# Patient Record
Sex: Male | Born: 1940 | Race: White | Hispanic: No | Smoking: Never smoker
Health system: Southern US, Community
[De-identification: ages and names within clinical notes are randomized; demographics above are authoritative.]

## PROBLEM LIST (undated history)

## (undated) DIAGNOSIS — N529 Male erectile dysfunction, unspecified: Secondary | ICD-10-CM

## (undated) DIAGNOSIS — E669 Obesity, unspecified: Secondary | ICD-10-CM

## (undated) DIAGNOSIS — A048 Other specified bacterial intestinal infections: Secondary | ICD-10-CM

## (undated) DIAGNOSIS — E785 Hyperlipidemia, unspecified: Secondary | ICD-10-CM

## (undated) DIAGNOSIS — K219 Gastro-esophageal reflux disease without esophagitis: Secondary | ICD-10-CM

## (undated) DIAGNOSIS — C439 Malignant melanoma of skin, unspecified: Secondary | ICD-10-CM

## (undated) DIAGNOSIS — I219 Acute myocardial infarction, unspecified: Secondary | ICD-10-CM

## (undated) DIAGNOSIS — I10 Essential (primary) hypertension: Secondary | ICD-10-CM

## (undated) DIAGNOSIS — N200 Calculus of kidney: Secondary | ICD-10-CM

## (undated) DIAGNOSIS — K635 Polyp of colon: Secondary | ICD-10-CM

## (undated) DIAGNOSIS — I2510 Atherosclerotic heart disease of native coronary artery without angina pectoris: Secondary | ICD-10-CM

---

## 2004-05-24 ENCOUNTER — Other Ambulatory Visit: Payer: Self-pay

## 2005-07-26 ENCOUNTER — Ambulatory Visit: Payer: Self-pay | Admitting: Ophthalmology

## 2005-07-31 ENCOUNTER — Ambulatory Visit: Payer: Self-pay | Admitting: Ophthalmology

## 2010-01-23 ENCOUNTER — Ambulatory Visit: Payer: Self-pay | Admitting: Gastroenterology

## 2010-03-05 ENCOUNTER — Emergency Department: Payer: Self-pay | Admitting: Emergency Medicine

## 2013-04-15 ENCOUNTER — Ambulatory Visit: Payer: Self-pay | Admitting: Gastroenterology

## 2013-12-22 DIAGNOSIS — E782 Mixed hyperlipidemia: Secondary | ICD-10-CM | POA: Insufficient documentation

## 2013-12-22 DIAGNOSIS — K219 Gastro-esophageal reflux disease without esophagitis: Secondary | ICD-10-CM | POA: Insufficient documentation

## 2013-12-22 DIAGNOSIS — I2510 Atherosclerotic heart disease of native coronary artery without angina pectoris: Secondary | ICD-10-CM | POA: Insufficient documentation

## 2015-03-11 DIAGNOSIS — I10 Essential (primary) hypertension: Secondary | ICD-10-CM | POA: Insufficient documentation

## 2015-10-14 DIAGNOSIS — K2270 Barrett's esophagus without dysplasia: Secondary | ICD-10-CM | POA: Diagnosis not present

## 2015-10-14 DIAGNOSIS — Z1211 Encounter for screening for malignant neoplasm of colon: Secondary | ICD-10-CM | POA: Diagnosis not present

## 2015-10-14 DIAGNOSIS — Z86010 Personal history of colonic polyps: Secondary | ICD-10-CM | POA: Diagnosis not present

## 2015-11-09 ENCOUNTER — Encounter: Payer: Self-pay | Admitting: *Deleted

## 2015-11-10 ENCOUNTER — Ambulatory Visit: Payer: PPO | Admitting: Anesthesiology

## 2015-11-10 ENCOUNTER — Encounter
Admission: RE | Disposition: A | Discharge status: Home / Self Care | Payer: Self-pay | Source: Ambulatory Visit | Attending: Gastroenterology

## 2015-11-10 ENCOUNTER — Encounter: Payer: Self-pay | Admitting: Anesthesiology

## 2015-11-10 ENCOUNTER — Ambulatory Visit
Admission: RE | Admit: 2015-11-10 | Discharge: 2015-11-10 | Disposition: A | Discharge status: Home / Self Care | Payer: PPO | Source: Ambulatory Visit | Attending: Gastroenterology | Admitting: Gastroenterology

## 2015-11-10 DIAGNOSIS — N529 Male erectile dysfunction, unspecified: Secondary | ICD-10-CM | POA: Insufficient documentation

## 2015-11-10 DIAGNOSIS — I2510 Atherosclerotic heart disease of native coronary artery without angina pectoris: Secondary | ICD-10-CM | POA: Insufficient documentation

## 2015-11-10 DIAGNOSIS — K649 Unspecified hemorrhoids: Secondary | ICD-10-CM | POA: Diagnosis not present

## 2015-11-10 DIAGNOSIS — K222 Esophageal obstruction: Secondary | ICD-10-CM | POA: Insufficient documentation

## 2015-11-10 DIAGNOSIS — I10 Essential (primary) hypertension: Secondary | ICD-10-CM | POA: Diagnosis not present

## 2015-11-10 DIAGNOSIS — Z7982 Long term (current) use of aspirin: Secondary | ICD-10-CM | POA: Diagnosis not present

## 2015-11-10 DIAGNOSIS — D123 Benign neoplasm of transverse colon: Secondary | ICD-10-CM | POA: Diagnosis not present

## 2015-11-10 DIAGNOSIS — K2270 Barrett's esophagus without dysplasia: Secondary | ICD-10-CM | POA: Insufficient documentation

## 2015-11-10 DIAGNOSIS — E785 Hyperlipidemia, unspecified: Secondary | ICD-10-CM | POA: Insufficient documentation

## 2015-11-10 DIAGNOSIS — K219 Gastro-esophageal reflux disease without esophagitis: Secondary | ICD-10-CM | POA: Diagnosis not present

## 2015-11-10 DIAGNOSIS — D120 Benign neoplasm of cecum: Secondary | ICD-10-CM | POA: Insufficient documentation

## 2015-11-10 DIAGNOSIS — Z85820 Personal history of malignant melanoma of skin: Secondary | ICD-10-CM | POA: Insufficient documentation

## 2015-11-10 DIAGNOSIS — I252 Old myocardial infarction: Secondary | ICD-10-CM | POA: Diagnosis not present

## 2015-11-10 DIAGNOSIS — K635 Polyp of colon: Secondary | ICD-10-CM | POA: Diagnosis not present

## 2015-11-10 DIAGNOSIS — Z1211 Encounter for screening for malignant neoplasm of colon: Secondary | ICD-10-CM | POA: Diagnosis not present

## 2015-11-10 DIAGNOSIS — Z79899 Other long term (current) drug therapy: Secondary | ICD-10-CM | POA: Insufficient documentation

## 2015-11-10 DIAGNOSIS — D122 Benign neoplasm of ascending colon: Secondary | ICD-10-CM | POA: Diagnosis not present

## 2015-11-10 DIAGNOSIS — Z86010 Personal history of colonic polyps: Secondary | ICD-10-CM | POA: Diagnosis not present

## 2015-11-10 HISTORY — PX: COLONOSCOPY WITH PROPOFOL: SHX5780

## 2015-11-10 HISTORY — DX: Other specified bacterial intestinal infections: A04.8

## 2015-11-10 HISTORY — PX: ESOPHAGOGASTRODUODENOSCOPY: SHX5428

## 2015-11-10 HISTORY — DX: Polyp of colon: K63.5

## 2015-11-10 HISTORY — DX: Hyperlipidemia, unspecified: E78.5

## 2015-11-10 HISTORY — DX: Calculus of kidney: N20.0

## 2015-11-10 HISTORY — DX: Gastro-esophageal reflux disease without esophagitis: K21.9

## 2015-11-10 HISTORY — DX: Atherosclerotic heart disease of native coronary artery without angina pectoris: I25.10

## 2015-11-10 HISTORY — DX: Obesity, unspecified: E66.9

## 2015-11-10 HISTORY — DX: Malignant melanoma of skin, unspecified: C43.9

## 2015-11-10 HISTORY — DX: Male erectile dysfunction, unspecified: N52.9

## 2015-11-10 HISTORY — DX: Acute myocardial infarction, unspecified: I21.9

## 2015-11-10 SURGERY — COLONOSCOPY WITH PROPOFOL
Anesthesia: General

## 2015-11-10 MED ORDER — PROPOFOL 500 MG/50ML IV EMUL
INTRAVENOUS | Status: DC | PRN
  Administered 2015-11-10: 150 ug/kg/min via INTRAVENOUS

## 2015-11-10 MED ORDER — SODIUM CHLORIDE 0.9 % IV SOLN
INTRAVENOUS | Status: DC
  Administered 2015-11-10: 1000 mL via INTRAVENOUS

## 2015-11-10 MED ORDER — PROPOFOL 10 MG/ML IV BOLUS
INTRAVENOUS | Status: DC | PRN
  Administered 2015-11-10: 100 mg via INTRAVENOUS
  Administered 2015-11-10: 50 mg via INTRAVENOUS

## 2015-11-10 MED ORDER — SODIUM CHLORIDE 0.9 % IV SOLN: INTRAVENOUS | Status: DC

## 2015-11-10 MED ORDER — LIDOCAINE HCL (CARDIAC) 20 MG/ML IV SOLN
INTRAVENOUS | Status: DC | PRN
  Administered 2015-11-10: 100 mg via INTRAVENOUS

## 2015-11-10 MED ORDER — GLYCOPYRROLATE 0.2 MG/ML IJ SOLN
INTRAMUSCULAR | Status: DC | PRN
  Administered 2015-11-10: 0.2 mg via INTRAVENOUS

## 2015-11-10 NOTE — Op Note (Signed)
[facility] Gastroenterology Patient Name: Jay Gregory Procedure Date: 11/10/2015 7:38 AM MRN: [id number] Account #: [id number] Date of Birth: 1941/01/24 Admit Type: Outpatient Age: 75 Room: [facility] ENDO ROOM 4 Gender: Male Note Status: Finalized Procedure:         Colonoscopy Indications:       Personal history of colonic polyps Providers:         Lupita Dawn. Candace Cruise, MD Referring MD:      Dion Body (Referring MD) Medicines:         Monitored Anesthesia Care Complications:     No immediate complications. Procedure:         Pre-Anesthesia Assessment:                    - Prior to the procedure, a History and Physical was                     performed, and patient medications, allergies and                     sensitivities were reviewed. The patient's tolerance of                     previous anesthesia was reviewed.                    - The risks and benefits of the procedure and the sedation                     options and risks were discussed with the patient. All                     questions were answered and informed consent was obtained.                    - After reviewing the risks and benefits, the patient was                     deemed in satisfactory condition to undergo the procedure.                    After obtaining informed consent, the colonoscope was                     passed under direct vision. Throughout the procedure, the                     patient's blood pressure, pulse, and oxygen saturations                     were monitored continuously. The Colonoscope was                     introduced through the anus and advanced to the the cecum,                     identified by appendiceal orifice and ileocecal valve. The                     colonoscopy was performed without difficulty. The patient                     tolerated the procedure well. The quality of the bowel  preparation was good. Findings:      A few sessile  polyps were found in the cecum. The polyps were diminutive       in size. These polyps were removed with a jumbo cold forceps. Resection       and retrieval were complete.      A diminutive polyp was found in the distal transverse colon. The polyp       was sessile. The polyp was removed with a jumbo cold forceps. Resection       and retrieval were complete.      A small polyp was found in the distal transverse colon. The polyp was       sessile. The polyp was removed with a cold snare. Resection and       retrieval were complete.      The exam was otherwise without abnormality. Impression:        - A few diminutive polyps in the cecum. Resected and                     retrieved.                    - One diminutive polyp in the distal transverse colon.                     Resected and retrieved.                    - One small polyp in the distal transverse colon. Resected                     and retrieved.                    - The examination was otherwise normal. Recommendation:    - Discharge patient to home.                    - Await pathology results.                    - Repeat colonoscopy in 5 years for surveillance based on                     pathology results. Procedure Code(s): --- Professional ---                    [phone], Colonoscopy, flexible; with removal of tumor(s),                     polyp(s), or other lesion(s) by snare technique                    45380, 59, Colonoscopy, flexible; with biopsy, single or                     multiple Diagnosis Code(s): --- Professional ---                    D12.0, Benign neoplasm of cecum                    D12.3, Benign neoplasm of transverse colon                    Z86.010, Personal history of colonic polyps CPT copyright 2014 American Medical Association. All rights reserved. The codes documented in this report  are preliminary and upon coder review may  be revised to meet current compliance requirements. Hulen Luster,  MD 11/10/2015 8:39:10 AM This report has been signed electronically. Number of Addenda: 0 Note Initiated On: 11/10/2015 7:38 AM Scope Withdrawal Time: 0 hours 14 minutes 7 seconds  Total Procedure Duration: 0 hours 17 minutes 17 seconds       [facility]

## 2015-11-10 NOTE — Anesthesia Preprocedure Evaluation (Signed)
Anesthesia Evaluation  Patient identified by MRN, date of birth, ID band Patient awake    Reviewed: Allergy & Precautions, H&P , NPO status , Patient's Chart, lab work & pertinent test results, reviewed documented beta blocker date and time   History of Anesthesia Complications Negative for: history of anesthetic complications  Airway Mallampati: III  TM Distance: >3 FB Neck ROM: full    Dental no notable dental hx. (+) Caps, Edentulous Upper, Partial Lower, Missing, Poor Dentition   Pulmonary neg pulmonary ROS,    Pulmonary exam normal breath sounds clear to auscultation       Cardiovascular Exercise Tolerance: Good hypertension, (-) angina+ CAD, + Past MI and + Cardiac Stents (2 stents in 1999 and 2000)  (-) CABG Normal cardiovascular exam(-) dysrhythmias (-) Valvular Problems/Murmurs Rhythm:regular Rate:Normal     Neuro/Psych negative neurological ROS  negative psych ROS   GI/Hepatic Neg liver ROS, GERD  Medicated and Controlled,  Endo/Other  negative endocrine ROS  Renal/GU Renal disease (kidney stones)  negative genitourinary   Musculoskeletal   Abdominal   Peds  Hematology negative hematology ROS (+)   Anesthesia Other Findings Past Medical History:   Colon polyps                                                 Coronary artery disease                                      Erectile dysfunction                                         GERD (gastroesophageal reflux disease)                       Helicobacter pylori infection                                Melanoma ([place])                                                 Comment:Right ear   Hyperlipidemia                                               Kidney stone                                                 Myocardial infarction ([place])                                  Obesity  Reproductive/Obstetrics negative  OB ROS                             Anesthesia Physical Anesthesia Plan  ASA: III  Anesthesia Plan: General   Post-op Pain Management:    Induction:   Airway Management Planned:   Additional Equipment:   Intra-op Plan:   Post-operative Plan:   Informed Consent: I have reviewed the patients History and Physical, chart, labs and discussed the procedure including the risks, benefits and alternatives for the proposed anesthesia with the patient or authorized representative who has indicated his/her understanding and acceptance.   Dental Advisory Given  Plan Discussed with: Anesthesiologist, CRNA and Surgeon  Anesthesia Plan Comments:         Anesthesia Quick Evaluation

## 2015-11-10 NOTE — Op Note (Signed)
[facility] Gastroenterology Patient Name: Jay Gregory Procedure Date: 11/10/2015 7:39 AM MRN: [id number] Account #: [id number] Date of Birth: September 11, 1941 Admit Type: Outpatient Age: 75 Room: [facility] ENDO ROOM 4 Gender: Male Note Status: Finalized Procedure:         Upper GI endoscopy Providers:         Lupita Dawn. Candace Cruise, MD Referring MD:      Dion Body (Referring MD) Medicines:         Monitored Anesthesia Care Complications:     No immediate complications. Procedure:         Pre-Anesthesia Assessment:                    - Prior to the procedure, a History and Physical was                     performed, and patient medications, allergies and                     sensitivities were reviewed. The patient's tolerance of                     previous anesthesia was reviewed.                    - The risks and benefits of the procedure and the sedation                     options and risks were discussed with the patient. All                     questions were answered and informed consent was obtained.                    - After reviewing the risks and benefits, the patient was                     deemed in satisfactory condition to undergo the procedure.                    After obtaining informed consent, the endoscope was passed                     under direct vision. Throughout the procedure, the                     patient's blood pressure, pulse, and oxygen saturations                     were monitored continuously. The Endoscope was introduced                     through the mouth, and advanced to the second part of                     duodenum. The upper GI endoscopy was accomplished without                     difficulty. The patient tolerated the procedure well. Findings:      There were esophageal mucosal changes consistent with short-segment       Barrett's esophagus present at the gastroesophageal junction. The       maximum longitudinal extent of these  mucosal changes was 0.5 cm in  length. Mucosa was biopsied with a cold forceps for histology. One       specimen bottle was sent to pathology. Mucosa was biopsied with a cold       forceps for histology in a targeted manner at the gastroesophageal       junction. One specimen bottle was sent to pathology.      The exam was otherwise without abnormality.      The entire examined stomach was normal.      The examined duodenum was normal.      One mild benign-appearing, intrinsic stenosis was found at the       gastroesophageal junction. The scope was withdrawn. Dilation was       performed with a Maloney dilator with mild resistance at 78 Fr. Impression:        - Esophageal mucosal changes consistent with short-segment                     Barrett's esophagus. Biopsied.                    - The examination was otherwise normal.                    - Normal stomach.                    - Normal examined duodenum.                    - Benign-appearing esophageal stenosis. Dilated. Recommendation:    - Discharge patient to home.                    - Observe patient's clinical course.                    - Continue present medications.                    - Repeat the upper endoscopy in 3 years for surveillance.                    - The findings and recommendations were discussed with the                     patient. Procedure Code(s): --- Professional ---                    [phone], Esophagogastroduodenoscopy, flexible, transoral;                     with biopsy, single or multiple                    43450, Dilation of esophagus, by unguided sound or bougie,                     single or multiple passes Diagnosis Code(s): --- Professional ---                    K22.8, Other specified diseases of esophagus                    K22.2, Esophageal obstruction CPT copyright 2014 American Medical Association. All rights reserved. The codes documented in this report are preliminary and upon coder  review may  be revised to meet current compliance requirements. Hulen Luster, MD 11/10/2015 8:17:08 AM This report has  been signed electronically. Number of Addenda: 0 Note Initiated On: 11/10/2015 7:39 AM      [facility]

## 2015-11-10 NOTE — Transfer of Care (Signed)
Immediate Anesthesia Transfer of Care Note  Patient: Jay Gregory  Procedure(s) Performed: Procedure(s): COLONOSCOPY WITH PROPOFOL (N/A) ESOPHAGOGASTRODUODENOSCOPY (EGD) (N/A)  Patient Location: Endoscopy Unit  Anesthesia Type:General  Level of Consciousness: sedated  Airway & Oxygen Therapy: Patient Spontanous Breathing and Patient connected to nasal cannula oxygen  Post-op Assessment: Report given to RN and Post -op Vital signs reviewed and stable  Post vital signs: Reviewed and stable  Last Vitals:  Filed Vitals:   11/10/15 0719 11/10/15 0841  BP: 157/90 113/76  Pulse: 97 110  Temp: 36.3 C 36.7 C  Resp: 16     Complications: No apparent anesthesia complications

## 2015-11-10 NOTE — Anesthesia Postprocedure Evaluation (Signed)
Anesthesia Post Note  Patient: Jay Gregory  Procedure(s) Performed: Procedure(s) (LRB): COLONOSCOPY WITH PROPOFOL (N/A) ESOPHAGOGASTRODUODENOSCOPY (EGD) (N/A)  Patient location during evaluation: Endoscopy Anesthesia Type: General Level of consciousness: awake and alert Pain management: pain level controlled Vital Signs Assessment: post-procedure vital signs reviewed and stable Respiratory status: spontaneous breathing, nonlabored ventilation, respiratory function stable and patient connected to nasal cannula oxygen Cardiovascular status: blood pressure returned to baseline and stable Postop Assessment: no signs of nausea or vomiting Anesthetic complications: no    Last Vitals:  Filed Vitals:   11/10/15 0901 11/10/15 0913  BP: 140/89 147/97  Pulse: 107 98  Temp:    Resp: 18 18    Last Pain: There were no vitals filed for this visit.               Martha Clan

## 2015-11-10 NOTE — H&P (Signed)
Date of Initial H&P: 10/14/2015  History reviewed, patient examined, no change in status, stable for surgery.

## 2015-11-11 ENCOUNTER — Encounter: Payer: Self-pay | Admitting: Gastroenterology

## 2015-11-11 LAB — SURGICAL PATHOLOGY

## 2016-02-07 DIAGNOSIS — E782 Mixed hyperlipidemia: Secondary | ICD-10-CM | POA: Diagnosis not present

## 2016-02-13 DIAGNOSIS — Z1283 Encounter for screening for malignant neoplasm of skin: Secondary | ICD-10-CM | POA: Diagnosis not present

## 2016-02-13 DIAGNOSIS — L564 Polymorphous light eruption: Secondary | ICD-10-CM | POA: Diagnosis not present

## 2016-02-13 DIAGNOSIS — L821 Other seborrheic keratosis: Secondary | ICD-10-CM | POA: Diagnosis not present

## 2016-02-13 DIAGNOSIS — L814 Other melanin hyperpigmentation: Secondary | ICD-10-CM | POA: Diagnosis not present

## 2016-02-13 DIAGNOSIS — L918 Other hypertrophic disorders of the skin: Secondary | ICD-10-CM | POA: Diagnosis not present

## 2016-02-13 DIAGNOSIS — D229 Melanocytic nevi, unspecified: Secondary | ICD-10-CM | POA: Diagnosis not present

## 2016-02-13 DIAGNOSIS — Z85820 Personal history of malignant melanoma of skin: Secondary | ICD-10-CM | POA: Diagnosis not present

## 2016-02-14 DIAGNOSIS — I10 Essential (primary) hypertension: Secondary | ICD-10-CM | POA: Diagnosis not present

## 2016-02-14 DIAGNOSIS — E782 Mixed hyperlipidemia: Secondary | ICD-10-CM | POA: Diagnosis not present

## 2016-02-14 DIAGNOSIS — K219 Gastro-esophageal reflux disease without esophagitis: Secondary | ICD-10-CM | POA: Diagnosis not present

## 2016-03-20 DIAGNOSIS — I10 Essential (primary) hypertension: Secondary | ICD-10-CM | POA: Diagnosis not present

## 2016-03-20 DIAGNOSIS — E782 Mixed hyperlipidemia: Secondary | ICD-10-CM | POA: Diagnosis not present

## 2016-03-20 DIAGNOSIS — K219 Gastro-esophageal reflux disease without esophagitis: Secondary | ICD-10-CM | POA: Diagnosis not present

## 2016-03-20 DIAGNOSIS — I2510 Atherosclerotic heart disease of native coronary artery without angina pectoris: Secondary | ICD-10-CM | POA: Diagnosis not present

## 2016-08-16 DIAGNOSIS — E782 Mixed hyperlipidemia: Secondary | ICD-10-CM | POA: Diagnosis not present

## 2016-08-16 DIAGNOSIS — I10 Essential (primary) hypertension: Secondary | ICD-10-CM | POA: Diagnosis not present

## 2016-08-16 DIAGNOSIS — K219 Gastro-esophageal reflux disease without esophagitis: Secondary | ICD-10-CM | POA: Diagnosis not present

## 2016-08-23 DIAGNOSIS — E782 Mixed hyperlipidemia: Secondary | ICD-10-CM | POA: Diagnosis not present

## 2016-08-23 DIAGNOSIS — R351 Nocturia: Secondary | ICD-10-CM | POA: Diagnosis not present

## 2016-08-23 DIAGNOSIS — N401 Enlarged prostate with lower urinary tract symptoms: Secondary | ICD-10-CM | POA: Diagnosis not present

## 2016-08-23 DIAGNOSIS — I10 Essential (primary) hypertension: Secondary | ICD-10-CM | POA: Diagnosis not present

## 2016-08-23 DIAGNOSIS — Z7189 Other specified counseling: Secondary | ICD-10-CM | POA: Insufficient documentation

## 2016-08-23 DIAGNOSIS — Z23 Encounter for immunization: Secondary | ICD-10-CM | POA: Diagnosis not present

## 2016-08-23 DIAGNOSIS — Z7185 Encounter for immunization safety counseling: Secondary | ICD-10-CM | POA: Insufficient documentation

## 2016-08-23 DIAGNOSIS — Z0000 Encounter for general adult medical examination without abnormal findings: Secondary | ICD-10-CM | POA: Diagnosis not present

## 2016-08-28 DIAGNOSIS — I10 Essential (primary) hypertension: Secondary | ICD-10-CM | POA: Diagnosis not present

## 2016-08-28 DIAGNOSIS — I2510 Atherosclerotic heart disease of native coronary artery without angina pectoris: Secondary | ICD-10-CM | POA: Diagnosis not present

## 2016-08-28 DIAGNOSIS — E782 Mixed hyperlipidemia: Secondary | ICD-10-CM | POA: Diagnosis not present

## 2016-08-28 DIAGNOSIS — K219 Gastro-esophageal reflux disease without esophagitis: Secondary | ICD-10-CM | POA: Diagnosis not present

## 2017-02-14 DIAGNOSIS — E782 Mixed hyperlipidemia: Secondary | ICD-10-CM | POA: Diagnosis not present

## 2017-02-14 DIAGNOSIS — I10 Essential (primary) hypertension: Secondary | ICD-10-CM | POA: Diagnosis not present

## 2017-02-18 DIAGNOSIS — D1801 Hemangioma of skin and subcutaneous tissue: Secondary | ICD-10-CM | POA: Diagnosis not present

## 2017-02-18 DIAGNOSIS — L821 Other seborrheic keratosis: Secondary | ICD-10-CM | POA: Diagnosis not present

## 2017-02-18 DIAGNOSIS — Z85820 Personal history of malignant melanoma of skin: Secondary | ICD-10-CM | POA: Diagnosis not present

## 2017-02-18 DIAGNOSIS — L578 Other skin changes due to chronic exposure to nonionizing radiation: Secondary | ICD-10-CM | POA: Diagnosis not present

## 2017-02-18 DIAGNOSIS — D229 Melanocytic nevi, unspecified: Secondary | ICD-10-CM | POA: Diagnosis not present

## 2017-02-18 DIAGNOSIS — L814 Other melanin hyperpigmentation: Secondary | ICD-10-CM | POA: Diagnosis not present

## 2017-02-18 DIAGNOSIS — Z1283 Encounter for screening for malignant neoplasm of skin: Secondary | ICD-10-CM | POA: Diagnosis not present

## 2017-02-18 DIAGNOSIS — L300 Nummular dermatitis: Secondary | ICD-10-CM | POA: Diagnosis not present

## 2017-02-21 DIAGNOSIS — N401 Enlarged prostate with lower urinary tract symptoms: Secondary | ICD-10-CM | POA: Diagnosis not present

## 2017-02-21 DIAGNOSIS — K219 Gastro-esophageal reflux disease without esophagitis: Secondary | ICD-10-CM | POA: Diagnosis not present

## 2017-02-21 DIAGNOSIS — R351 Nocturia: Secondary | ICD-10-CM | POA: Diagnosis not present

## 2017-02-21 DIAGNOSIS — I10 Essential (primary) hypertension: Secondary | ICD-10-CM | POA: Diagnosis not present

## 2017-02-21 DIAGNOSIS — E782 Mixed hyperlipidemia: Secondary | ICD-10-CM | POA: Diagnosis not present

## 2017-03-11 DIAGNOSIS — I10 Essential (primary) hypertension: Secondary | ICD-10-CM | POA: Diagnosis not present

## 2017-03-11 DIAGNOSIS — E782 Mixed hyperlipidemia: Secondary | ICD-10-CM | POA: Diagnosis not present

## 2017-03-11 DIAGNOSIS — K219 Gastro-esophageal reflux disease without esophagitis: Secondary | ICD-10-CM | POA: Diagnosis not present

## 2017-03-11 DIAGNOSIS — I2510 Atherosclerotic heart disease of native coronary artery without angina pectoris: Secondary | ICD-10-CM | POA: Diagnosis not present

## 2017-08-19 DIAGNOSIS — I10 Essential (primary) hypertension: Secondary | ICD-10-CM | POA: Diagnosis not present

## 2017-08-19 DIAGNOSIS — E782 Mixed hyperlipidemia: Secondary | ICD-10-CM | POA: Diagnosis not present

## 2017-08-19 DIAGNOSIS — Z0000 Encounter for general adult medical examination without abnormal findings: Secondary | ICD-10-CM | POA: Diagnosis not present

## 2017-08-26 DIAGNOSIS — Z66 Do not resuscitate: Secondary | ICD-10-CM | POA: Insufficient documentation

## 2017-08-26 DIAGNOSIS — E782 Mixed hyperlipidemia: Secondary | ICD-10-CM | POA: Diagnosis not present

## 2017-08-26 DIAGNOSIS — Z0000 Encounter for general adult medical examination without abnormal findings: Secondary | ICD-10-CM | POA: Insufficient documentation

## 2017-08-26 DIAGNOSIS — I10 Essential (primary) hypertension: Secondary | ICD-10-CM | POA: Diagnosis not present

## 2017-09-17 DIAGNOSIS — R040 Epistaxis: Secondary | ICD-10-CM | POA: Diagnosis not present

## 2017-09-17 DIAGNOSIS — E782 Mixed hyperlipidemia: Secondary | ICD-10-CM | POA: Diagnosis not present

## 2017-09-17 DIAGNOSIS — I10 Essential (primary) hypertension: Secondary | ICD-10-CM | POA: Diagnosis not present

## 2017-09-17 DIAGNOSIS — I2510 Atherosclerotic heart disease of native coronary artery without angina pectoris: Secondary | ICD-10-CM | POA: Diagnosis not present

## 2017-10-07 DIAGNOSIS — H02132 Senile ectropion of right lower eyelid: Secondary | ICD-10-CM | POA: Diagnosis not present

## 2017-12-05 DIAGNOSIS — H04521 Eversion of right lacrimal punctum: Secondary | ICD-10-CM | POA: Diagnosis not present

## 2018-02-06 DIAGNOSIS — H04521 Eversion of right lacrimal punctum: Secondary | ICD-10-CM | POA: Diagnosis not present

## 2018-02-06 DIAGNOSIS — H02132 Senile ectropion of right lower eyelid: Secondary | ICD-10-CM | POA: Diagnosis not present

## 2018-02-18 DIAGNOSIS — E782 Mixed hyperlipidemia: Secondary | ICD-10-CM | POA: Diagnosis not present

## 2018-02-18 DIAGNOSIS — Z85820 Personal history of malignant melanoma of skin: Secondary | ICD-10-CM | POA: Diagnosis not present

## 2018-02-18 DIAGNOSIS — D2239 Melanocytic nevi of other parts of face: Secondary | ICD-10-CM | POA: Diagnosis not present

## 2018-02-18 DIAGNOSIS — D224 Melanocytic nevi of scalp and neck: Secondary | ICD-10-CM | POA: Diagnosis not present

## 2018-02-18 DIAGNOSIS — L905 Scar conditions and fibrosis of skin: Secondary | ICD-10-CM | POA: Diagnosis not present

## 2018-02-18 DIAGNOSIS — I8393 Asymptomatic varicose veins of bilateral lower extremities: Secondary | ICD-10-CM | POA: Diagnosis not present

## 2018-02-18 DIAGNOSIS — I10 Essential (primary) hypertension: Secondary | ICD-10-CM | POA: Diagnosis not present

## 2018-02-18 DIAGNOSIS — D229 Melanocytic nevi, unspecified: Secondary | ICD-10-CM | POA: Diagnosis not present

## 2018-02-18 DIAGNOSIS — L821 Other seborrheic keratosis: Secondary | ICD-10-CM | POA: Diagnosis not present

## 2018-02-18 DIAGNOSIS — Z1283 Encounter for screening for malignant neoplasm of skin: Secondary | ICD-10-CM | POA: Diagnosis not present

## 2018-02-18 DIAGNOSIS — L812 Freckles: Secondary | ICD-10-CM | POA: Diagnosis not present

## 2018-02-18 DIAGNOSIS — D1800 Hemangioma unspecified site: Secondary | ICD-10-CM | POA: Diagnosis not present

## 2018-02-25 DIAGNOSIS — E782 Mixed hyperlipidemia: Secondary | ICD-10-CM | POA: Diagnosis not present

## 2018-02-25 DIAGNOSIS — E669 Obesity, unspecified: Secondary | ICD-10-CM | POA: Diagnosis not present

## 2018-02-25 DIAGNOSIS — I10 Essential (primary) hypertension: Secondary | ICD-10-CM | POA: Diagnosis not present

## 2018-02-25 DIAGNOSIS — K219 Gastro-esophageal reflux disease without esophagitis: Secondary | ICD-10-CM | POA: Diagnosis not present

## 2018-03-25 DIAGNOSIS — I2510 Atherosclerotic heart disease of native coronary artery without angina pectoris: Secondary | ICD-10-CM | POA: Diagnosis not present

## 2018-03-25 DIAGNOSIS — E782 Mixed hyperlipidemia: Secondary | ICD-10-CM | POA: Diagnosis not present

## 2018-03-25 DIAGNOSIS — I10 Essential (primary) hypertension: Secondary | ICD-10-CM | POA: Diagnosis not present

## 2018-08-21 DIAGNOSIS — E782 Mixed hyperlipidemia: Secondary | ICD-10-CM | POA: Diagnosis not present

## 2018-08-21 DIAGNOSIS — K219 Gastro-esophageal reflux disease without esophagitis: Secondary | ICD-10-CM | POA: Diagnosis not present

## 2018-08-21 DIAGNOSIS — I10 Essential (primary) hypertension: Secondary | ICD-10-CM | POA: Diagnosis not present

## 2018-08-28 DIAGNOSIS — Z0000 Encounter for general adult medical examination without abnormal findings: Secondary | ICD-10-CM | POA: Diagnosis not present

## 2018-08-28 DIAGNOSIS — E782 Mixed hyperlipidemia: Secondary | ICD-10-CM | POA: Diagnosis not present

## 2018-09-25 DIAGNOSIS — I2510 Atherosclerotic heart disease of native coronary artery without angina pectoris: Secondary | ICD-10-CM | POA: Diagnosis not present

## 2018-09-25 DIAGNOSIS — E782 Mixed hyperlipidemia: Secondary | ICD-10-CM | POA: Diagnosis not present

## 2018-09-25 DIAGNOSIS — I10 Essential (primary) hypertension: Secondary | ICD-10-CM | POA: Diagnosis not present

## 2018-11-26 DIAGNOSIS — M654 Radial styloid tenosynovitis [de Quervain]: Secondary | ICD-10-CM | POA: Diagnosis not present

## 2018-11-26 DIAGNOSIS — M1811 Unilateral primary osteoarthritis of first carpometacarpal joint, right hand: Secondary | ICD-10-CM | POA: Diagnosis not present

## 2019-02-17 DIAGNOSIS — L812 Freckles: Secondary | ICD-10-CM | POA: Diagnosis not present

## 2019-02-17 DIAGNOSIS — D485 Neoplasm of uncertain behavior of skin: Secondary | ICD-10-CM | POA: Diagnosis not present

## 2019-02-17 DIAGNOSIS — Z1283 Encounter for screening for malignant neoplasm of skin: Secondary | ICD-10-CM | POA: Diagnosis not present

## 2019-02-17 DIAGNOSIS — L300 Nummular dermatitis: Secondary | ICD-10-CM | POA: Diagnosis not present

## 2019-02-17 DIAGNOSIS — Z85820 Personal history of malignant melanoma of skin: Secondary | ICD-10-CM | POA: Diagnosis not present

## 2019-02-17 DIAGNOSIS — L820 Inflamed seborrheic keratosis: Secondary | ICD-10-CM | POA: Diagnosis not present

## 2019-02-17 DIAGNOSIS — L821 Other seborrheic keratosis: Secondary | ICD-10-CM | POA: Diagnosis not present

## 2019-02-17 DIAGNOSIS — D229 Melanocytic nevi, unspecified: Secondary | ICD-10-CM | POA: Diagnosis not present

## 2019-02-17 DIAGNOSIS — D225 Melanocytic nevi of trunk: Secondary | ICD-10-CM | POA: Diagnosis not present

## 2019-02-19 DIAGNOSIS — E782 Mixed hyperlipidemia: Secondary | ICD-10-CM | POA: Diagnosis not present

## 2019-02-26 DIAGNOSIS — E782 Mixed hyperlipidemia: Secondary | ICD-10-CM | POA: Diagnosis not present

## 2019-02-26 DIAGNOSIS — Z0000 Encounter for general adult medical examination without abnormal findings: Secondary | ICD-10-CM | POA: Diagnosis not present

## 2019-02-26 DIAGNOSIS — E669 Obesity, unspecified: Secondary | ICD-10-CM | POA: Diagnosis not present

## 2019-02-26 DIAGNOSIS — I10 Essential (primary) hypertension: Secondary | ICD-10-CM | POA: Diagnosis not present

## 2019-04-02 DIAGNOSIS — I2510 Atherosclerotic heart disease of native coronary artery without angina pectoris: Secondary | ICD-10-CM | POA: Diagnosis not present

## 2019-04-02 DIAGNOSIS — I10 Essential (primary) hypertension: Secondary | ICD-10-CM | POA: Diagnosis not present

## 2019-04-02 DIAGNOSIS — E782 Mixed hyperlipidemia: Secondary | ICD-10-CM | POA: Diagnosis not present

## 2019-04-20 DIAGNOSIS — D485 Neoplasm of uncertain behavior of skin: Secondary | ICD-10-CM | POA: Diagnosis not present

## 2019-04-20 DIAGNOSIS — L988 Other specified disorders of the skin and subcutaneous tissue: Secondary | ICD-10-CM | POA: Diagnosis not present

## 2019-04-27 DIAGNOSIS — D485 Neoplasm of uncertain behavior of skin: Secondary | ICD-10-CM | POA: Diagnosis not present

## 2019-06-25 DIAGNOSIS — E782 Mixed hyperlipidemia: Secondary | ICD-10-CM | POA: Diagnosis not present

## 2019-06-25 DIAGNOSIS — I10 Essential (primary) hypertension: Secondary | ICD-10-CM | POA: Diagnosis not present

## 2019-07-02 DIAGNOSIS — I10 Essential (primary) hypertension: Secondary | ICD-10-CM | POA: Diagnosis not present

## 2019-07-02 DIAGNOSIS — E782 Mixed hyperlipidemia: Secondary | ICD-10-CM | POA: Diagnosis not present

## 2019-07-02 DIAGNOSIS — Z125 Encounter for screening for malignant neoplasm of prostate: Secondary | ICD-10-CM | POA: Diagnosis not present

## 2019-07-02 DIAGNOSIS — R350 Frequency of micturition: Secondary | ICD-10-CM | POA: Diagnosis not present

## 2019-07-02 DIAGNOSIS — E669 Obesity, unspecified: Secondary | ICD-10-CM | POA: Diagnosis not present

## 2019-07-03 ENCOUNTER — Other Ambulatory Visit: Payer: Self-pay | Admitting: Family Medicine

## 2019-07-03 DIAGNOSIS — R350 Frequency of micturition: Secondary | ICD-10-CM

## 2019-07-03 DIAGNOSIS — R310 Gross hematuria: Secondary | ICD-10-CM

## 2019-07-17 ENCOUNTER — Other Ambulatory Visit: Payer: Self-pay

## 2019-07-17 ENCOUNTER — Ambulatory Visit
Admission: RE | Admit: 2019-07-17 | Discharge: 2019-07-17 | Disposition: A | Discharge status: Home / Self Care | Payer: PPO | Source: Ambulatory Visit | Attending: Family Medicine | Admitting: Family Medicine

## 2019-07-17 DIAGNOSIS — R350 Frequency of micturition: Secondary | ICD-10-CM | POA: Diagnosis not present

## 2019-07-17 DIAGNOSIS — R310 Gross hematuria: Secondary | ICD-10-CM | POA: Diagnosis not present

## 2019-07-17 DIAGNOSIS — N200 Calculus of kidney: Secondary | ICD-10-CM | POA: Diagnosis not present

## 2019-07-17 HISTORY — DX: Essential (primary) hypertension: I10

## 2019-07-17 MED ORDER — IOHEXOL 300 MG/ML  SOLN
125.0000 mL | Freq: Once | INTRAMUSCULAR | Status: AC | PRN
  Administered 2019-07-17: 16:00:00 125 mL via INTRAVENOUS

## 2019-08-05 ENCOUNTER — Ambulatory Visit: Payer: Self-pay | Admitting: Urology

## 2019-08-12 ENCOUNTER — Encounter: Payer: Self-pay | Admitting: Urology

## 2019-08-12 ENCOUNTER — Other Ambulatory Visit: Payer: Self-pay

## 2019-08-12 ENCOUNTER — Ambulatory Visit: Payer: PPO | Admitting: Urology

## 2019-08-12 VITALS — BP 148/88 | HR 85 | Ht 69.0 in | Wt 216.6 lb

## 2019-08-12 DIAGNOSIS — R350 Frequency of micturition: Secondary | ICD-10-CM

## 2019-08-12 DIAGNOSIS — R3129 Other microscopic hematuria: Secondary | ICD-10-CM

## 2019-08-12 DIAGNOSIS — R310 Gross hematuria: Secondary | ICD-10-CM | POA: Diagnosis not present

## 2019-08-12 NOTE — Progress Notes (Signed)
08/12/2019 3:09 PM   Jay Gregory 26-Jul-1941 [id number]  Referring provider: Dion Body, MD [facility] [place],  [place] [address]  Chief Complaint  Patient presents with  . Hematuria    HPI: Jay Gregory is an 78 y.o. male seen at the request of Dr. Netty Starring for microhematuria and urinary frequency. He was seen at [facility] on 07/02/2019 for routine Medicare wellness visit and had noted increased urinary frequency with nocturia x2-4.  Denied gross hematuria.   Urinalysis did show 10-50 RBCs on microscopy.  He was started on tamsulosin but states he only took 1 capsule due to dizziness and the day he took the medication he had onset of flank pain which was identical to prior episodes of renal colic.  He is fairly certain he passed a stone and had resolution of his symptoms.   CT abdomen/pelvis with and without contrast was ordered which showed a small 8 mm right renal cyst and a 3 mm left renal calculus.  This was performed after his episode of flank pain.  He presently has no bothersome lower urinary tract symptoms.   PMH: Past Medical History:  Diagnosis Date  . Colon polyps   . Coronary artery disease   . Erectile dysfunction   . GERD (gastroesophageal reflux disease)   . Helicobacter pylori infection   . Hyperlipidemia   . Hypertension   . Kidney stone   . Melanoma ([place])    Right ear  . Myocardial infarction ([place])   . Obesity     Surgical History: Past Surgical History:  Procedure Laterality Date  . COLONOSCOPY WITH PROPOFOL N/A 11/10/2015   Procedure: COLONOSCOPY WITH PROPOFOL;  Surgeon: Hulen Luster, MD;  Location: [facility] ENDOSCOPY;  Service: Gastroenterology;  Laterality: N/A;  . ESOPHAGOGASTRODUODENOSCOPY N/A 11/10/2015   Procedure: ESOPHAGOGASTRODUODENOSCOPY (EGD);  Surgeon: Hulen Luster, MD;  Location: [facility] ENDOSCOPY;  Service: Gastroenterology;  Laterality: N/A;    Home Medications:  Allergies as of 08/12/2019    Reactions   Azasite [azithromycin]    Ciprofloxacin    Levaquin [levofloxacin]    Niaspan [niacin Er]    Vitamin D Analogs       Medication List       Accurate as of August 12, 2019  3:09 PM. If you have any questions, ask your nurse or doctor.        Apple Cider Vinegar 300 MG Tabs Take 1 tablet by mouth.   aspirin EC 81 MG tablet Take 81 mg by mouth daily.   clobetasol cream 0.05 % Commonly known as: TEMOVATE   KRILL OIL PO Take 1 capsule by mouth.   LACTOBACILLUS PO Take by mouth.   losartan 25 MG tablet Commonly known as: COZAAR Take 25 mg by mouth daily.   MULTIVITAMIN ADULT PO Take by mouth.   omeprazole 20 MG capsule Commonly known as: PRILOSEC Take 20 mg by mouth daily.   simvastatin 40 MG tablet Commonly known as: ZOCOR Take 40 mg by mouth daily.   tamsulosin 0.4 MG Caps capsule Commonly known as: FLOMAX Take by mouth.   telmisartan 80 MG tablet Commonly known as: MICARDIS Take by mouth.   VITAMIN B COMPLEX PO Take by mouth.       Allergies:  Allergies  Allergen Reactions  . Azasite [Azithromycin]   . Ciprofloxacin   . Levaquin [Levofloxacin]   . Niaspan [Niacin Er]   . Vitamin D Analogs     Family History: No family history  on file.  Social History:  reports that he has never smoked. He has never used smokeless tobacco. He reports previous alcohol use. He reports that he does not use drugs.  ROS: UROLOGY Frequent Urination?: Yes Hard to postpone urination?: Yes Burning/pain with urination?: No Get up at night to urinate?: Yes Leakage of urine?: Yes Urine stream starts and stops?: No Trouble starting stream?: No Do you have to strain to urinate?: No Blood in urine?: No Urinary tract infection?: No Sexually transmitted disease?: No Injury to kidneys or bladder?: No Painful intercourse?: No Weak stream?: No Erection problems?: No Penile pain?: No  Gastrointestinal Nausea?: No Vomiting?: No Indigestion/heartburn?:  No Diarrhea?: No Constipation?: No  Constitutional Fever: No Night sweats?: No Weight loss?: No Fatigue?: No  Skin Skin rash/lesions?: No Itching?: No  Eyes Blurred vision?: No Double vision?: No  Ears/Nose/Throat Sore throat?: No Sinus problems?: No  Hematologic/Lymphatic Swollen glands?: No Easy bruising?: No  Cardiovascular Leg swelling?: No Chest pain?: No  Respiratory Cough?: No Shortness of breath?: No  Endocrine Excessive thirst?: No  Musculoskeletal Back pain?: No Joint pain?: No  Neurological Headaches?: No Dizziness?: No  Psychologic Depression?: No Anxiety?: No  Physical Exam: BP (!) 148/88 (BP Location: Left Arm, Patient Position: Sitting, Cuff Size: Normal)   Pulse 85   Ht 5\' 9"  (1.753 m)   Wt 216 lb 9.6 oz (98.2 kg)   BMI 31.99 kg/m   Constitutional:  Alert and oriented, No acute distress. HEENT: [place] AT, moist mucus membranes.  Trachea midline, no masses. Cardiovascular: No clubbing, cyanosis, or edema. Respiratory: Normal respiratory effort, no increased work of breathing. GI: Abdomen is soft, nontender, nondistended, no abdominal masses GU: No CVA tenderness Lymph: No cervical or inguinal lymphadenopathy. Skin: No rashes, bruises or suspicious lesions. Neurologic: Grossly intact, no focal deficits, moving all 4 extremities. Psychiatric: Normal mood and affect.   Urinalysis Dipstick/microscopy negative  Imaging: CT images personally reviewed  Assessment & Plan:    - Microhematuria Within a day after his urinalysis that showed significant microhematuria was performed he developed left flank pain and is fairly certain he passed a stone.  Urinalysis today was normal.  CT urogram shows a small, nonobstructing left renal calculus and small right renal cyst.  We discussed the standard recommendations of high risk hematuria which include CT urogram for upper tract evaluation and cystoscopy for lower tract evaluation.  He declined to  pursue cystoscopy and was willing to accept the risk of a missed lower tract tumor/urothelial carcinoma.  At minimum I did recommend he have his urinalysis checked periodically with Dr. Netty Starring and for persistent microhematuria would recommend he reconsider his decision not to have cystoscopy.   Abbie Sons, [place] [address] [place],  [address] [phone]

## 2019-08-13 ENCOUNTER — Encounter: Payer: Self-pay | Admitting: Urology

## 2019-08-13 DIAGNOSIS — R3129 Other microscopic hematuria: Secondary | ICD-10-CM | POA: Insufficient documentation

## 2019-08-13 LAB — URINALYSIS, COMPLETE
Bilirubin, UA: NEGATIVE
Glucose, UA: NEGATIVE
Ketones, UA: NEGATIVE
Leukocytes,UA: NEGATIVE
Nitrite, UA: NEGATIVE
Protein,UA: NEGATIVE
Specific Gravity, UA: >1.03 — ABNORMAL HIGH (ref 1.005–1.030)
Urobilinogen, Ur: 1 mg/dL (ref 0.2–1.0)
pH, UA: 5 (ref 5.0–7.5)

## 2019-08-13 LAB — MICROSCOPIC EXAMINATION
Bacteria, UA: NONE SEEN
Epithelial Cells (non renal): NONE SEEN /hpf (ref 0–10)

## 2019-08-20 DIAGNOSIS — H0100A Unspecified blepharitis right eye, upper and lower eyelids: Secondary | ICD-10-CM | POA: Diagnosis not present

## 2019-09-24 DIAGNOSIS — I10 Essential (primary) hypertension: Secondary | ICD-10-CM | POA: Diagnosis not present

## 2019-09-24 DIAGNOSIS — I25118 Atherosclerotic heart disease of native coronary artery with other forms of angina pectoris: Secondary | ICD-10-CM | POA: Diagnosis not present

## 2019-09-24 DIAGNOSIS — E782 Mixed hyperlipidemia: Secondary | ICD-10-CM | POA: Diagnosis not present

## 2019-09-24 DIAGNOSIS — I700 Atherosclerosis of aorta: Secondary | ICD-10-CM | POA: Diagnosis not present

## 2019-10-21 DIAGNOSIS — I700 Atherosclerosis of aorta: Secondary | ICD-10-CM | POA: Diagnosis not present

## 2019-10-21 DIAGNOSIS — E782 Mixed hyperlipidemia: Secondary | ICD-10-CM | POA: Diagnosis not present

## 2019-10-21 DIAGNOSIS — I25118 Atherosclerotic heart disease of native coronary artery with other forms of angina pectoris: Secondary | ICD-10-CM | POA: Diagnosis not present

## 2019-10-21 DIAGNOSIS — I6523 Occlusion and stenosis of bilateral carotid arteries: Secondary | ICD-10-CM | POA: Diagnosis not present

## 2019-10-21 DIAGNOSIS — I10 Essential (primary) hypertension: Secondary | ICD-10-CM | POA: Diagnosis not present

## 2019-12-24 DIAGNOSIS — Z0000 Encounter for general adult medical examination without abnormal findings: Secondary | ICD-10-CM | POA: Diagnosis not present

## 2019-12-24 DIAGNOSIS — Z8719 Personal history of other diseases of the digestive system: Secondary | ICD-10-CM | POA: Diagnosis not present

## 2019-12-24 DIAGNOSIS — E782 Mixed hyperlipidemia: Secondary | ICD-10-CM | POA: Diagnosis not present

## 2019-12-24 DIAGNOSIS — I10 Essential (primary) hypertension: Secondary | ICD-10-CM | POA: Diagnosis not present

## 2019-12-24 DIAGNOSIS — Z86010 Personal history of colonic polyps: Secondary | ICD-10-CM | POA: Diagnosis not present

## 2019-12-24 DIAGNOSIS — E669 Obesity, unspecified: Secondary | ICD-10-CM | POA: Diagnosis not present

## 2020-02-24 DIAGNOSIS — I2510 Atherosclerotic heart disease of native coronary artery without angina pectoris: Secondary | ICD-10-CM | POA: Diagnosis not present

## 2020-02-24 DIAGNOSIS — Z86010 Personal history of colonic polyps: Secondary | ICD-10-CM | POA: Diagnosis not present

## 2020-02-24 DIAGNOSIS — K219 Gastro-esophageal reflux disease without esophagitis: Secondary | ICD-10-CM | POA: Diagnosis not present

## 2020-02-24 DIAGNOSIS — K2270 Barrett's esophagus without dysplasia: Secondary | ICD-10-CM | POA: Diagnosis not present

## 2020-02-24 DIAGNOSIS — E669 Obesity, unspecified: Secondary | ICD-10-CM | POA: Diagnosis not present

## 2020-02-24 DIAGNOSIS — I700 Atherosclerosis of aorta: Secondary | ICD-10-CM | POA: Diagnosis not present

## 2020-04-19 DIAGNOSIS — I700 Atherosclerosis of aorta: Secondary | ICD-10-CM | POA: Diagnosis not present

## 2020-04-19 DIAGNOSIS — I2510 Atherosclerotic heart disease of native coronary artery without angina pectoris: Secondary | ICD-10-CM | POA: Diagnosis not present

## 2020-04-19 DIAGNOSIS — E782 Mixed hyperlipidemia: Secondary | ICD-10-CM | POA: Diagnosis not present

## 2020-04-19 DIAGNOSIS — I6523 Occlusion and stenosis of bilateral carotid arteries: Secondary | ICD-10-CM | POA: Diagnosis not present

## 2020-04-19 DIAGNOSIS — I10 Essential (primary) hypertension: Secondary | ICD-10-CM | POA: Diagnosis not present

## 2020-06-10 ENCOUNTER — Ambulatory Visit: Admit: 2020-06-10 | Payer: PPO | Admitting: Internal Medicine

## 2020-06-10 SURGERY — COLONOSCOPY WITH PROPOFOL
Anesthesia: General

## 2020-06-21 DIAGNOSIS — E782 Mixed hyperlipidemia: Secondary | ICD-10-CM | POA: Diagnosis not present

## 2020-06-28 DIAGNOSIS — E669 Obesity, unspecified: Secondary | ICD-10-CM | POA: Diagnosis not present

## 2020-06-28 DIAGNOSIS — D696 Thrombocytopenia, unspecified: Secondary | ICD-10-CM | POA: Diagnosis not present

## 2020-06-28 DIAGNOSIS — I10 Essential (primary) hypertension: Secondary | ICD-10-CM | POA: Diagnosis not present

## 2020-06-28 DIAGNOSIS — E782 Mixed hyperlipidemia: Secondary | ICD-10-CM | POA: Diagnosis not present

## 2020-06-28 DIAGNOSIS — Z0000 Encounter for general adult medical examination without abnormal findings: Secondary | ICD-10-CM | POA: Diagnosis not present

## 2020-06-30 IMAGING — CT CT ABD-PEL WO/W CM
3 of 12 series · 12 of 46 positions shown, 18 images · IV contrast (omnipaque)
Comparison: None.

CLINICAL DATA: Microscopic hematuria

EXAM:
CT ABDOMEN AND PELVIS WITHOUT AND WITH CONTRAST
TECHNIQUE: Multidetector CT imaging of the abdomen and pelvis was performed
following the standard protocol before and following the bolus
administration of intravenous contrast.
CONTRAST:  125mL OMNIPAQUE IOHEXOL 300 MG/ML  SOLN

[Series 5: cor without without pre · coronal · non-contrast · 0.83mm/px · 2 of 161 slices shown, 3 images]
[im 54/161  soft-tissue]
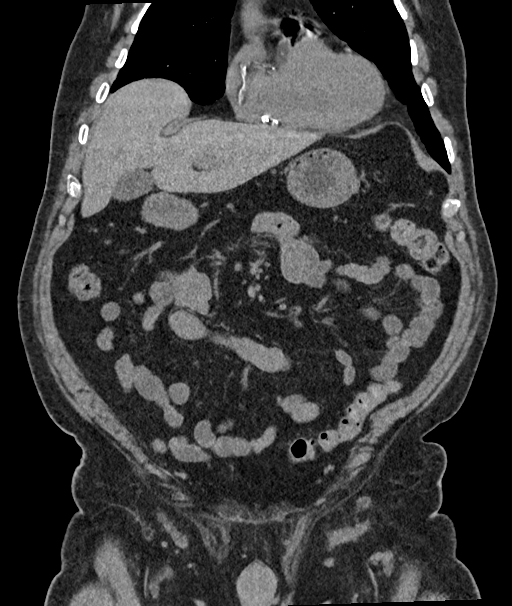
[im 54/161  bone]
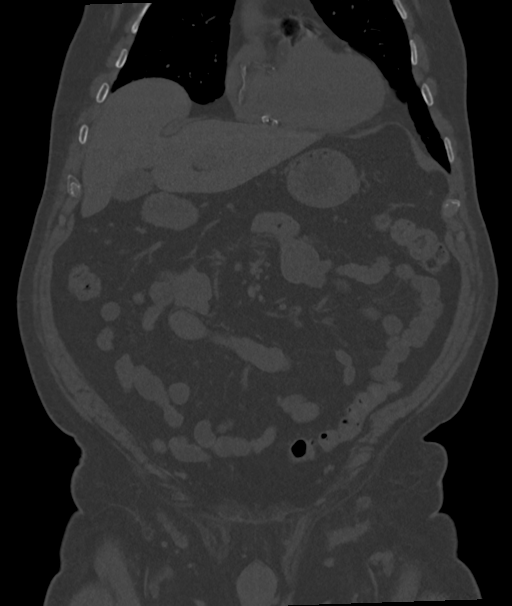
[im 107/161  soft-tissue]
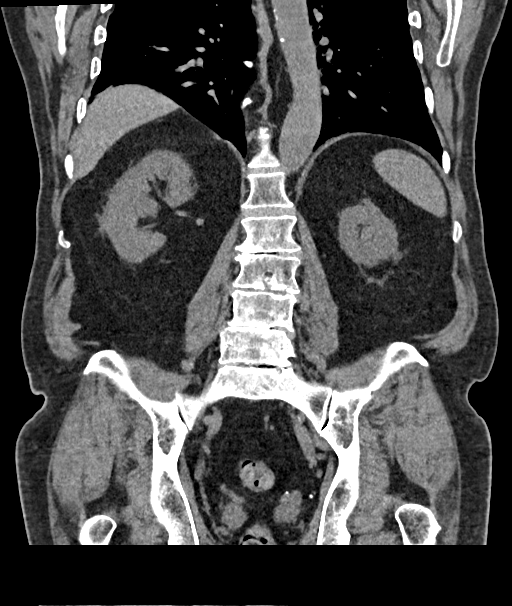

[Series 9: axial with hematuria with · axial · 0.89mm/px · z∈[-1478,-1268]mm · 4 of 100 slices shown]
[im 15/100  soft-tissue]
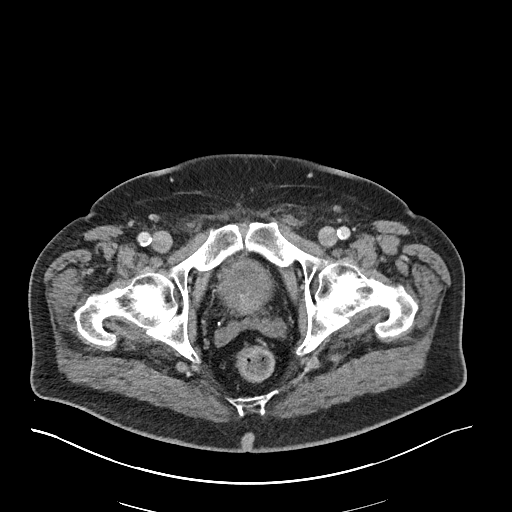
[im 29/100  soft-tissue]
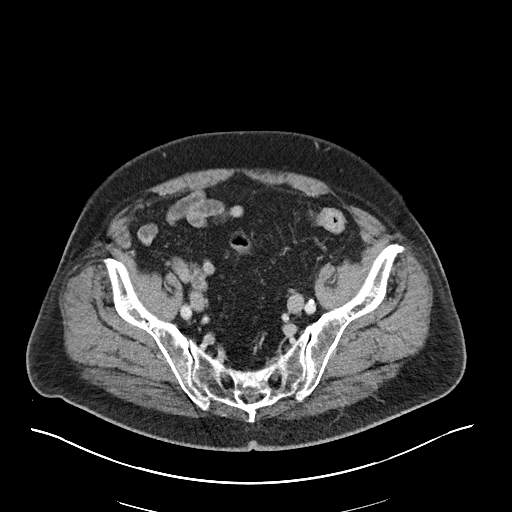
[im 43/100  soft-tissue]
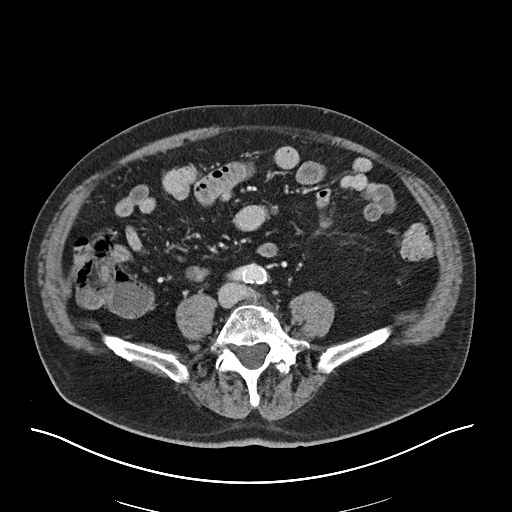
[im 57/100  soft-tissue]
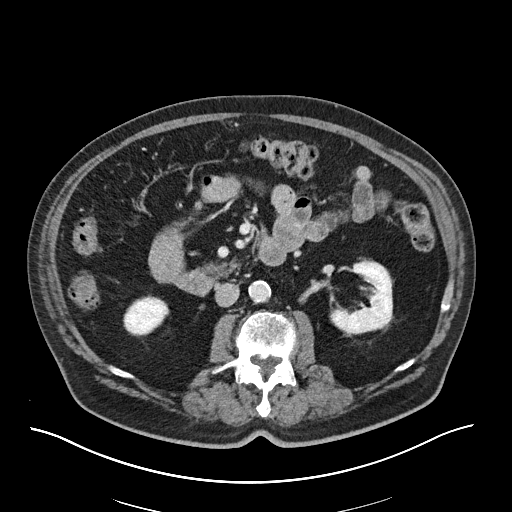

[Series 17: axial delay delay prone · axial · delayed · 0.82mm/px · z∈[-1410,-1050]mm · 6 of 102 slices shown, 11 images]
[im 15/102  soft-tissue]
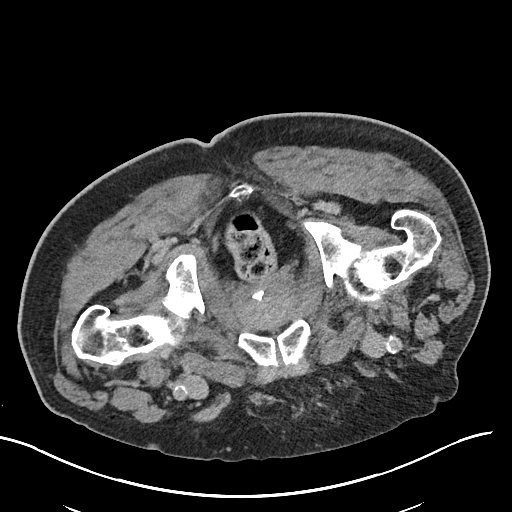
[im 15/102  bone]
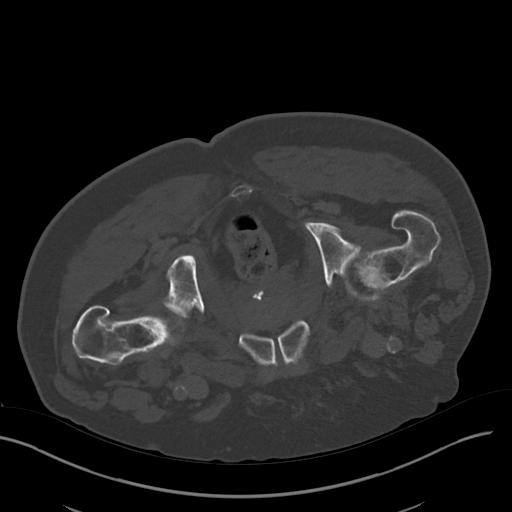
[im 29/102  soft-tissue]
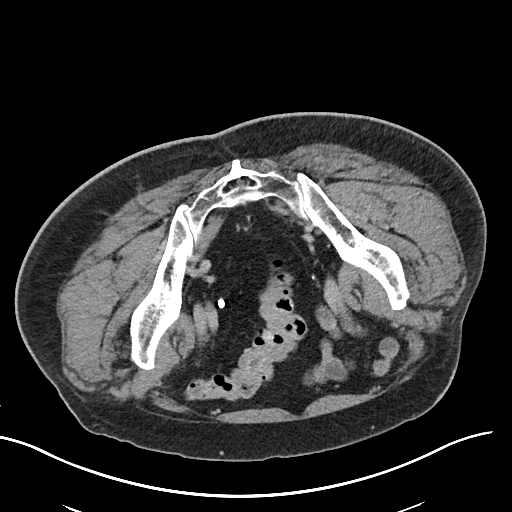
[im 44/102  soft-tissue]
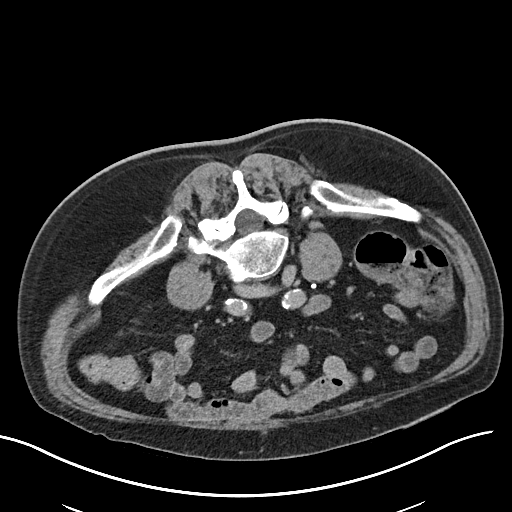
[im 44/102  lung]
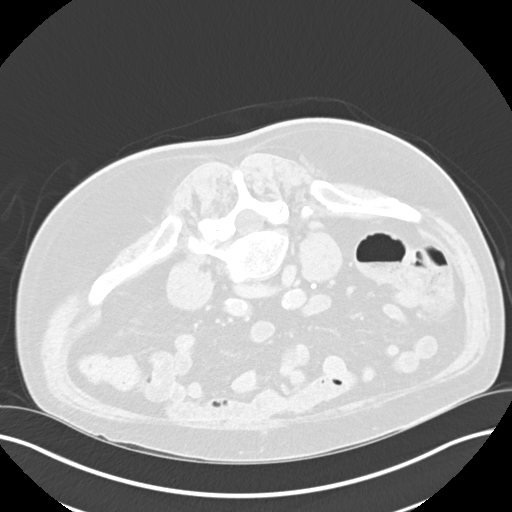
[im 58/102  soft-tissue]
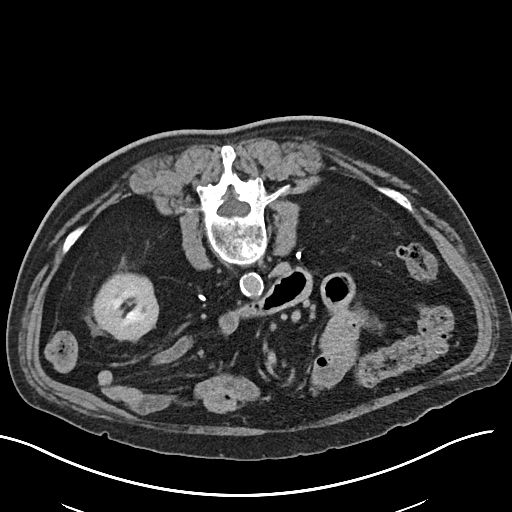
[im 58/102  lung]
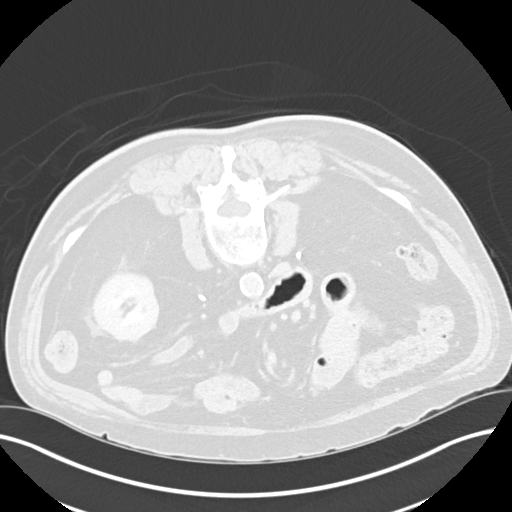
[im 73/102  soft-tissue]
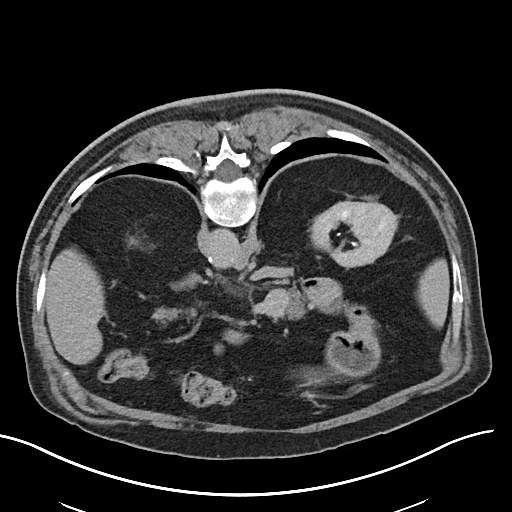
[im 73/102  lung]
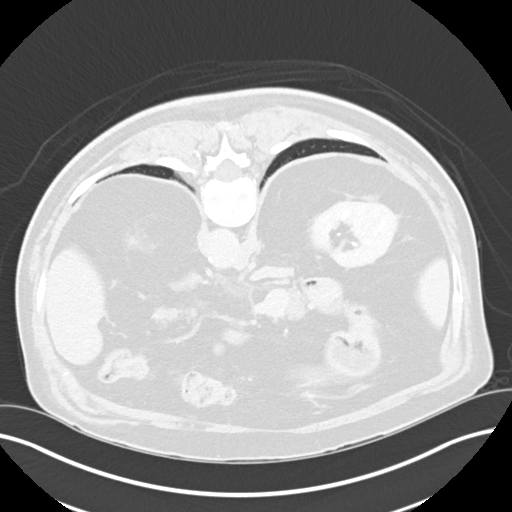
[im 87/102  soft-tissue]
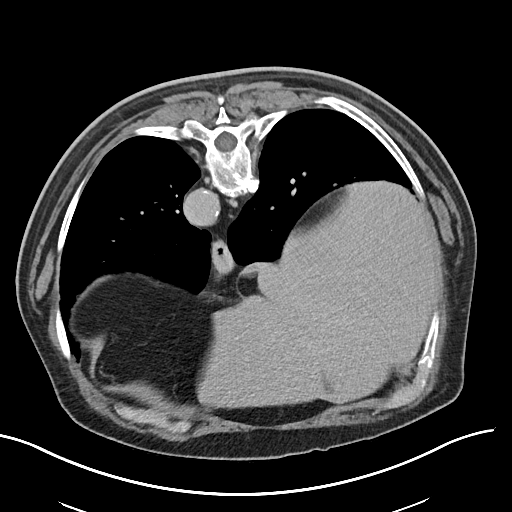
[im 87/102  lung]
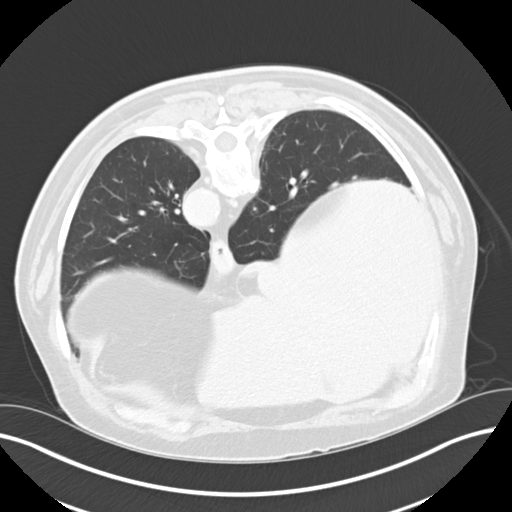

[12 of 46 positions shown; findings below may reference images not displayed]

FINDINGS: Lower chest: Lung bases are clear.

Hepatobiliary: Liver is within normal limits.

Gallbladder is unremarkable. No intrahepatic or extrahepatic ductal
dilatation.

Pancreas: Within normal limits.

Spleen: Within normal limits.

Adrenals/Urinary Tract: Adrenal glands are within normal limits.

Small left renal cysts measuring up to 8 mm. Right kidney is within
normal limits. No enhancing renal lesions.

3 mm nonobstructing interpolar left renal calculus (series 5/image
85). No renal, ureteral, or bladder calculi. No hydronephrosis.

On delayed imaging, there are no filling defects in the bilateral
opacified proximal collecting systems, ureters, or bladder. Tiny
left ureterocele.

Mildly thick-walled, trabeculated bladder.

Stomach/Bowel: Stomach is within normal limits.

No evidence of bowel obstruction.

Duodenal diverticulum.

Appendix is not discretely visualized.

Mild sigmoid diverticulosis, without evidence of diverticulitis.

Vascular/Lymphatic: No evidence of abdominal aortic aneurysm.

Atherosclerotic calcifications of the abdominal aorta and branch
vessels.

No suspicious abdominopelvic lymphadenopathy.

Reproductive: Prostatomegaly.

Other: No abdominopelvic ascites.

Musculoskeletal: Degenerative changes of the visualized
thoracolumbar spine.
IMPRESSION: Small left renal cysts measuring up to 8 mm, benign (Bosniak I). No
enhancing renal lesions.

3 mm nonobstructing interpolar left renal calculus. No ureteral or
bladder calculi. No hydronephrosis.

Mildly thick-walled, trabeculated bladder, possibly reflecting
chronic bladder outlet obstruction. Suspected tiny left ureterocele.

Prostatomegaly.

## 2020-10-31 DIAGNOSIS — I2510 Atherosclerotic heart disease of native coronary artery without angina pectoris: Secondary | ICD-10-CM | POA: Diagnosis not present

## 2020-10-31 DIAGNOSIS — E782 Mixed hyperlipidemia: Secondary | ICD-10-CM | POA: Diagnosis not present

## 2020-10-31 DIAGNOSIS — I10 Essential (primary) hypertension: Secondary | ICD-10-CM | POA: Diagnosis not present

## 2020-10-31 DIAGNOSIS — I700 Atherosclerosis of aorta: Secondary | ICD-10-CM | POA: Diagnosis not present

## 2020-10-31 DIAGNOSIS — I6523 Occlusion and stenosis of bilateral carotid arteries: Secondary | ICD-10-CM | POA: Diagnosis not present

## 2020-12-21 DIAGNOSIS — D696 Thrombocytopenia, unspecified: Secondary | ICD-10-CM | POA: Diagnosis not present

## 2020-12-21 DIAGNOSIS — E782 Mixed hyperlipidemia: Secondary | ICD-10-CM | POA: Diagnosis not present

## 2020-12-28 DIAGNOSIS — E669 Obesity, unspecified: Secondary | ICD-10-CM | POA: Diagnosis not present

## 2020-12-28 DIAGNOSIS — D696 Thrombocytopenia, unspecified: Secondary | ICD-10-CM | POA: Diagnosis not present

## 2020-12-28 DIAGNOSIS — E782 Mixed hyperlipidemia: Secondary | ICD-10-CM | POA: Diagnosis not present

## 2020-12-28 DIAGNOSIS — Z0000 Encounter for general adult medical examination without abnormal findings: Secondary | ICD-10-CM | POA: Diagnosis not present

## 2021-06-28 DIAGNOSIS — D696 Thrombocytopenia, unspecified: Secondary | ICD-10-CM | POA: Diagnosis not present

## 2021-06-28 DIAGNOSIS — E782 Mixed hyperlipidemia: Secondary | ICD-10-CM | POA: Diagnosis not present

## 2021-07-05 DIAGNOSIS — I10 Essential (primary) hypertension: Secondary | ICD-10-CM | POA: Diagnosis not present

## 2021-07-05 DIAGNOSIS — Z0000 Encounter for general adult medical examination without abnormal findings: Secondary | ICD-10-CM | POA: Diagnosis not present

## 2021-07-05 DIAGNOSIS — E782 Mixed hyperlipidemia: Secondary | ICD-10-CM | POA: Diagnosis not present

## 2021-07-05 DIAGNOSIS — E669 Obesity, unspecified: Secondary | ICD-10-CM | POA: Diagnosis not present

## 2021-07-05 DIAGNOSIS — D696 Thrombocytopenia, unspecified: Secondary | ICD-10-CM | POA: Diagnosis not present

## 2021-11-23 DIAGNOSIS — I10 Essential (primary) hypertension: Secondary | ICD-10-CM | POA: Diagnosis not present

## 2021-11-23 DIAGNOSIS — I700 Atherosclerosis of aorta: Secondary | ICD-10-CM | POA: Diagnosis not present

## 2021-11-23 DIAGNOSIS — E782 Mixed hyperlipidemia: Secondary | ICD-10-CM | POA: Diagnosis not present

## 2021-11-23 DIAGNOSIS — I6523 Occlusion and stenosis of bilateral carotid arteries: Secondary | ICD-10-CM | POA: Diagnosis not present

## 2021-11-23 DIAGNOSIS — I25118 Atherosclerotic heart disease of native coronary artery with other forms of angina pectoris: Secondary | ICD-10-CM | POA: Diagnosis not present

## 2021-12-14 DIAGNOSIS — I25118 Atherosclerotic heart disease of native coronary artery with other forms of angina pectoris: Secondary | ICD-10-CM | POA: Diagnosis not present

## 2021-12-22 DIAGNOSIS — I700 Atherosclerosis of aorta: Secondary | ICD-10-CM | POA: Diagnosis not present

## 2021-12-22 DIAGNOSIS — I10 Essential (primary) hypertension: Secondary | ICD-10-CM | POA: Diagnosis not present

## 2021-12-22 DIAGNOSIS — E782 Mixed hyperlipidemia: Secondary | ICD-10-CM | POA: Diagnosis not present

## 2021-12-22 DIAGNOSIS — I6523 Occlusion and stenosis of bilateral carotid arteries: Secondary | ICD-10-CM | POA: Diagnosis not present

## 2021-12-22 DIAGNOSIS — I25118 Atherosclerotic heart disease of native coronary artery with other forms of angina pectoris: Secondary | ICD-10-CM | POA: Diagnosis not present

## 2021-12-22 DIAGNOSIS — D696 Thrombocytopenia, unspecified: Secondary | ICD-10-CM | POA: Diagnosis not present

## 2021-12-29 DIAGNOSIS — Z0000 Encounter for general adult medical examination without abnormal findings: Secondary | ICD-10-CM | POA: Diagnosis not present

## 2021-12-29 DIAGNOSIS — E669 Obesity, unspecified: Secondary | ICD-10-CM | POA: Diagnosis not present

## 2021-12-29 DIAGNOSIS — E782 Mixed hyperlipidemia: Secondary | ICD-10-CM | POA: Diagnosis not present

## 2021-12-29 DIAGNOSIS — I10 Essential (primary) hypertension: Secondary | ICD-10-CM | POA: Diagnosis not present

## 2021-12-29 DIAGNOSIS — D696 Thrombocytopenia, unspecified: Secondary | ICD-10-CM | POA: Diagnosis not present

## 2022-01-05 DIAGNOSIS — H353132 Nonexudative age-related macular degeneration, bilateral, intermediate dry stage: Secondary | ICD-10-CM | POA: Diagnosis not present

## 2022-04-04 DIAGNOSIS — D696 Thrombocytopenia, unspecified: Secondary | ICD-10-CM | POA: Diagnosis not present

## 2022-04-04 DIAGNOSIS — E782 Mixed hyperlipidemia: Secondary | ICD-10-CM | POA: Diagnosis not present

## 2022-04-11 DIAGNOSIS — E782 Mixed hyperlipidemia: Secondary | ICD-10-CM | POA: Diagnosis not present

## 2022-04-11 DIAGNOSIS — E669 Obesity, unspecified: Secondary | ICD-10-CM | POA: Diagnosis not present

## 2022-04-11 DIAGNOSIS — D696 Thrombocytopenia, unspecified: Secondary | ICD-10-CM | POA: Diagnosis not present

## 2022-04-11 DIAGNOSIS — I10 Essential (primary) hypertension: Secondary | ICD-10-CM | POA: Diagnosis not present

## 2022-06-18 DIAGNOSIS — I700 Atherosclerosis of aorta: Secondary | ICD-10-CM | POA: Diagnosis not present

## 2022-06-18 DIAGNOSIS — I25118 Atherosclerotic heart disease of native coronary artery with other forms of angina pectoris: Secondary | ICD-10-CM | POA: Diagnosis not present

## 2022-06-18 DIAGNOSIS — E782 Mixed hyperlipidemia: Secondary | ICD-10-CM | POA: Diagnosis not present

## 2022-06-18 DIAGNOSIS — I6523 Occlusion and stenosis of bilateral carotid arteries: Secondary | ICD-10-CM | POA: Diagnosis not present

## 2022-06-18 DIAGNOSIS — I10 Essential (primary) hypertension: Secondary | ICD-10-CM | POA: Diagnosis not present

## 2022-07-04 DIAGNOSIS — E782 Mixed hyperlipidemia: Secondary | ICD-10-CM | POA: Diagnosis not present

## 2022-07-04 DIAGNOSIS — D696 Thrombocytopenia, unspecified: Secondary | ICD-10-CM | POA: Diagnosis not present

## 2022-07-06 DIAGNOSIS — H353132 Nonexudative age-related macular degeneration, bilateral, intermediate dry stage: Secondary | ICD-10-CM | POA: Diagnosis not present

## 2022-07-12 DIAGNOSIS — I10 Essential (primary) hypertension: Secondary | ICD-10-CM | POA: Diagnosis not present

## 2022-07-12 DIAGNOSIS — E669 Obesity, unspecified: Secondary | ICD-10-CM | POA: Diagnosis not present

## 2022-07-12 DIAGNOSIS — Z0000 Encounter for general adult medical examination without abnormal findings: Secondary | ICD-10-CM | POA: Diagnosis not present

## 2022-07-12 DIAGNOSIS — E782 Mixed hyperlipidemia: Secondary | ICD-10-CM | POA: Diagnosis not present

## 2023-01-03 DIAGNOSIS — Z961 Presence of intraocular lens: Secondary | ICD-10-CM | POA: Diagnosis not present

## 2023-01-03 DIAGNOSIS — H43813 Vitreous degeneration, bilateral: Secondary | ICD-10-CM | POA: Diagnosis not present

## 2023-01-03 DIAGNOSIS — H353132 Nonexudative age-related macular degeneration, bilateral, intermediate dry stage: Secondary | ICD-10-CM | POA: Diagnosis not present

## 2023-01-15 DIAGNOSIS — E782 Mixed hyperlipidemia: Secondary | ICD-10-CM | POA: Diagnosis not present

## 2023-01-22 DIAGNOSIS — I10 Essential (primary) hypertension: Secondary | ICD-10-CM | POA: Diagnosis not present

## 2023-01-22 DIAGNOSIS — Z0000 Encounter for general adult medical examination without abnormal findings: Secondary | ICD-10-CM | POA: Diagnosis not present

## 2023-01-22 DIAGNOSIS — I2510 Atherosclerotic heart disease of native coronary artery without angina pectoris: Secondary | ICD-10-CM | POA: Diagnosis not present

## 2023-01-22 DIAGNOSIS — E669 Obesity, unspecified: Secondary | ICD-10-CM | POA: Diagnosis not present

## 2023-01-22 DIAGNOSIS — E782 Mixed hyperlipidemia: Secondary | ICD-10-CM | POA: Diagnosis not present

## 2023-01-29 DIAGNOSIS — I10 Essential (primary) hypertension: Secondary | ICD-10-CM | POA: Diagnosis not present

## 2023-01-29 DIAGNOSIS — I6523 Occlusion and stenosis of bilateral carotid arteries: Secondary | ICD-10-CM | POA: Diagnosis not present

## 2023-01-29 DIAGNOSIS — E782 Mixed hyperlipidemia: Secondary | ICD-10-CM | POA: Diagnosis not present

## 2023-01-29 DIAGNOSIS — I2510 Atherosclerotic heart disease of native coronary artery without angina pectoris: Secondary | ICD-10-CM | POA: Diagnosis not present

## 2023-01-29 DIAGNOSIS — I700 Atherosclerosis of aorta: Secondary | ICD-10-CM | POA: Diagnosis not present

## 2023-05-17 DIAGNOSIS — I2510 Atherosclerotic heart disease of native coronary artery without angina pectoris: Secondary | ICD-10-CM | POA: Diagnosis not present

## 2023-05-17 DIAGNOSIS — E782 Mixed hyperlipidemia: Secondary | ICD-10-CM | POA: Diagnosis not present

## 2023-05-24 DIAGNOSIS — I2510 Atherosclerotic heart disease of native coronary artery without angina pectoris: Secondary | ICD-10-CM | POA: Diagnosis not present

## 2023-05-24 DIAGNOSIS — E782 Mixed hyperlipidemia: Secondary | ICD-10-CM | POA: Diagnosis not present

## 2023-05-24 DIAGNOSIS — I10 Essential (primary) hypertension: Secondary | ICD-10-CM | POA: Diagnosis not present

## 2023-05-24 DIAGNOSIS — E669 Obesity, unspecified: Secondary | ICD-10-CM | POA: Diagnosis not present

## 2023-07-08 DIAGNOSIS — H43813 Vitreous degeneration, bilateral: Secondary | ICD-10-CM | POA: Diagnosis not present

## 2023-07-08 DIAGNOSIS — Z961 Presence of intraocular lens: Secondary | ICD-10-CM | POA: Diagnosis not present

## 2023-07-08 DIAGNOSIS — H353132 Nonexudative age-related macular degeneration, bilateral, intermediate dry stage: Secondary | ICD-10-CM | POA: Diagnosis not present

## 2023-08-14 DIAGNOSIS — E782 Mixed hyperlipidemia: Secondary | ICD-10-CM | POA: Diagnosis not present

## 2023-08-21 DIAGNOSIS — K219 Gastro-esophageal reflux disease without esophagitis: Secondary | ICD-10-CM | POA: Diagnosis not present

## 2023-08-21 DIAGNOSIS — Z0000 Encounter for general adult medical examination without abnormal findings: Secondary | ICD-10-CM | POA: Diagnosis not present

## 2023-08-21 DIAGNOSIS — E66811 Obesity, class 1: Secondary | ICD-10-CM | POA: Diagnosis not present

## 2023-08-21 DIAGNOSIS — E782 Mixed hyperlipidemia: Secondary | ICD-10-CM | POA: Diagnosis not present

## 2023-08-21 DIAGNOSIS — I10 Essential (primary) hypertension: Secondary | ICD-10-CM | POA: Diagnosis not present

## 2023-10-31 DIAGNOSIS — I6523 Occlusion and stenosis of bilateral carotid arteries: Secondary | ICD-10-CM | POA: Diagnosis not present

## 2023-10-31 DIAGNOSIS — I700 Atherosclerosis of aorta: Secondary | ICD-10-CM | POA: Diagnosis not present

## 2023-10-31 DIAGNOSIS — I77810 Thoracic aortic ectasia: Secondary | ICD-10-CM | POA: Diagnosis not present

## 2023-10-31 DIAGNOSIS — I2510 Atherosclerotic heart disease of native coronary artery without angina pectoris: Secondary | ICD-10-CM | POA: Diagnosis not present

## 2024-02-14 DIAGNOSIS — I10 Essential (primary) hypertension: Secondary | ICD-10-CM | POA: Diagnosis not present

## 2024-02-14 DIAGNOSIS — E782 Mixed hyperlipidemia: Secondary | ICD-10-CM | POA: Diagnosis not present

## 2024-02-21 DIAGNOSIS — Z0000 Encounter for general adult medical examination without abnormal findings: Secondary | ICD-10-CM | POA: Diagnosis not present

## 2024-02-21 DIAGNOSIS — E782 Mixed hyperlipidemia: Secondary | ICD-10-CM | POA: Diagnosis not present

## 2024-02-21 DIAGNOSIS — E66811 Obesity, class 1: Secondary | ICD-10-CM | POA: Diagnosis not present

## 2024-02-21 DIAGNOSIS — I10 Essential (primary) hypertension: Secondary | ICD-10-CM | POA: Diagnosis not present

## 2024-03-03 DIAGNOSIS — Z961 Presence of intraocular lens: Secondary | ICD-10-CM | POA: Diagnosis not present

## 2024-03-03 DIAGNOSIS — H353132 Nonexudative age-related macular degeneration, bilateral, intermediate dry stage: Secondary | ICD-10-CM | POA: Diagnosis not present

## 2024-03-03 DIAGNOSIS — H43813 Vitreous degeneration, bilateral: Secondary | ICD-10-CM | POA: Diagnosis not present

## 2024-04-29 DIAGNOSIS — E782 Mixed hyperlipidemia: Secondary | ICD-10-CM | POA: Diagnosis not present

## 2024-04-29 DIAGNOSIS — I340 Nonrheumatic mitral (valve) insufficiency: Secondary | ICD-10-CM | POA: Diagnosis not present

## 2024-04-29 DIAGNOSIS — I10 Essential (primary) hypertension: Secondary | ICD-10-CM | POA: Diagnosis not present

## 2024-04-29 DIAGNOSIS — I6523 Occlusion and stenosis of bilateral carotid arteries: Secondary | ICD-10-CM | POA: Diagnosis not present

## 2024-04-29 DIAGNOSIS — I2510 Atherosclerotic heart disease of native coronary artery without angina pectoris: Secondary | ICD-10-CM | POA: Diagnosis not present

## 2024-05-06 DIAGNOSIS — I2510 Atherosclerotic heart disease of native coronary artery without angina pectoris: Secondary | ICD-10-CM | POA: Diagnosis not present

## 2024-05-06 DIAGNOSIS — I340 Nonrheumatic mitral (valve) insufficiency: Secondary | ICD-10-CM | POA: Diagnosis not present

## 2024-05-06 DIAGNOSIS — I6523 Occlusion and stenosis of bilateral carotid arteries: Secondary | ICD-10-CM | POA: Diagnosis not present

## 2024-08-19 DIAGNOSIS — E782 Mixed hyperlipidemia: Secondary | ICD-10-CM | POA: Diagnosis not present

## 2024-08-25 DIAGNOSIS — Z0000 Encounter for general adult medical examination without abnormal findings: Secondary | ICD-10-CM | POA: Diagnosis not present

## 2024-08-25 DIAGNOSIS — E782 Mixed hyperlipidemia: Secondary | ICD-10-CM | POA: Diagnosis not present

## 2024-08-25 DIAGNOSIS — E66811 Obesity, class 1: Secondary | ICD-10-CM | POA: Diagnosis not present

## 2024-08-25 DIAGNOSIS — K219 Gastro-esophageal reflux disease without esophagitis: Secondary | ICD-10-CM | POA: Diagnosis not present

## 2024-08-25 DIAGNOSIS — I10 Essential (primary) hypertension: Secondary | ICD-10-CM | POA: Diagnosis not present

## 2024-08-25 DIAGNOSIS — Z1331 Encounter for screening for depression: Secondary | ICD-10-CM | POA: Diagnosis not present
